# Patient Record
Sex: Female | Born: 1965 | Race: White | Hispanic: No | Smoking: Never smoker
Health system: Southern US, Community
[De-identification: ages and names within clinical notes are randomized; demographics above are authoritative.]

## PROBLEM LIST (undated history)

## (undated) DIAGNOSIS — I10 Essential (primary) hypertension: Secondary | ICD-10-CM

## (undated) DIAGNOSIS — M797 Fibromyalgia: Secondary | ICD-10-CM

## (undated) DIAGNOSIS — K589 Irritable bowel syndrome without diarrhea: Secondary | ICD-10-CM

## (undated) DIAGNOSIS — N390 Urinary tract infection, site not specified: Secondary | ICD-10-CM

## (undated) DIAGNOSIS — E7800 Pure hypercholesterolemia, unspecified: Secondary | ICD-10-CM

## (undated) DIAGNOSIS — K219 Gastro-esophageal reflux disease without esophagitis: Secondary | ICD-10-CM

## (undated) HISTORY — DX: Urinary tract infection, site not specified: N39.0

## (undated) HISTORY — PX: OTHER SURGICAL HISTORY: SHX169

---

## 1997-12-12 ENCOUNTER — Inpatient Hospital Stay (HOSPITAL_COMMUNITY): Admission: AD | Admit: 1997-12-12 | Discharge: 1997-12-15 | Payer: Self-pay | Admitting: Obstetrics & Gynecology

## 1999-12-11 ENCOUNTER — Ambulatory Visit (HOSPITAL_COMMUNITY): Admission: RE | Admit: 1999-12-11 | Discharge: 1999-12-11 | Payer: Self-pay | Admitting: Gastroenterology

## 1999-12-11 ENCOUNTER — Encounter (INDEPENDENT_AMBULATORY_CARE_PROVIDER_SITE_OTHER): Payer: Self-pay | Admitting: *Deleted

## 2001-07-08 ENCOUNTER — Other Ambulatory Visit: Admission: RE | Admit: 2001-07-08 | Discharge: 2001-07-08 | Payer: Self-pay | Admitting: Obstetrics and Gynecology

## 2004-01-23 ENCOUNTER — Ambulatory Visit: Payer: Self-pay | Admitting: Family Medicine

## 2005-07-12 ENCOUNTER — Ambulatory Visit: Payer: Self-pay | Admitting: Family Medicine

## 2005-11-19 ENCOUNTER — Other Ambulatory Visit: Admission: RE | Admit: 2005-11-19 | Discharge: 2005-11-19 | Payer: Self-pay | Admitting: Obstetrics and Gynecology

## 2006-06-20 ENCOUNTER — Ambulatory Visit: Payer: Self-pay | Admitting: Family Medicine

## 2006-09-17 ENCOUNTER — Encounter: Admission: RE | Admit: 2006-09-17 | Discharge: 2006-11-07 | Payer: Self-pay | Admitting: Neurology

## 2006-12-05 ENCOUNTER — Encounter: Admission: RE | Admit: 2006-12-05 | Discharge: 2007-01-02 | Payer: Self-pay | Admitting: Orthopaedic Surgery

## 2009-05-02 ENCOUNTER — Other Ambulatory Visit: Admission: RE | Admit: 2009-05-02 | Discharge: 2009-05-02 | Payer: Self-pay | Admitting: Obstetrics & Gynecology

## 2009-05-04 ENCOUNTER — Ambulatory Visit (HOSPITAL_COMMUNITY): Admission: RE | Admit: 2009-05-04 | Discharge: 2009-05-04 | Payer: Self-pay | Admitting: Obstetrics & Gynecology

## 2009-05-11 ENCOUNTER — Encounter: Admission: RE | Admit: 2009-05-11 | Discharge: 2009-05-11 | Payer: Self-pay | Admitting: Obstetrics & Gynecology

## 2010-03-31 ENCOUNTER — Encounter: Payer: Self-pay | Admitting: Obstetrics and Gynecology

## 2010-07-27 NOTE — Procedures (Signed)
[place]. [facility]  Patient:    Bianca Payne, Bianca Payne                        MRN: [id number] Proc. Date: 12/11/99 Adm. Date:  [id number] Attending:  Nelda Marseille CC:         Colon Flattery, D.O.   Procedure Report  PROCEDURE PERFORMED:  Colonoscopy with biopsy.  INDICATION:  Diarrhea and some incontinence.  CONSENT:  Consent was signed after risks, benefits, methods and options thoroughly discussed in the office.  MEDICINES USED:  Demerol 100, Versed 10.  DESCRIPTION OF PROCEDURE: Rectal inspection was pertinent for small external hemorrhoids.  Digital exam was negative.  Video pediatric colonoscope was inserted with some difficulty due to a tortuous colon.  We were able to advance into the cecum.  No obvious abnormalities seen on insertion.  To advance to the cecum did require some abdominal pressure but no position changes.  The cecum was identified by the appendiceal orifice and the ileocecal valve.  In fact the scope was inserted a short ways into the terminal ileum which was normal.  Photo documentation was obtained.  Terminal ileum was normal.  Scattered random biopsies were obtained.  The scope was then slowly withdrawn.  The colon was normal.  Prep was adequate.  There was some liquid stool that required washing and suctioning. We did take some random biopsies through all areas on slow withdrawal. Once back in the rectum, the scope was retroflexed pertinent for some internal hemorrhoids.  On straight visualization of the rectum, a tiny 2 mm hyperplastic polyp was seen which was cold biopsied as well.  The scope was reinserted a short ways up the sigmoid.  Air was suctioned and the scope removed.  The patient tolerated the procedure well.  There was no obvious immediate complication.  ENDOSCOPIC DIAGNOSES: 1. Small internal and external hemorrhoids. 2. Tortuous colon. 3. Tiny rectal polyps status post cold biopsy. 4. Otherwise within normal  limits to the terminal ileum status post random    biopsies throughout.  PLAN:  Await pathology.  Trial of Librax.  Call p.r.n. and otherwise follow up in [place] in one to two months to recheck symptoms and decide any other workup and plan or medicine trails. DD:  12/11/99 TD:  12/12/99 Job: [id number]

## 2010-08-31 ENCOUNTER — Other Ambulatory Visit: Payer: Self-pay | Admitting: Obstetrics & Gynecology

## 2010-08-31 ENCOUNTER — Other Ambulatory Visit (HOSPITAL_COMMUNITY)
Admission: RE | Admit: 2010-08-31 | Discharge: 2010-08-31 | Disposition: A | Discharge status: Home / Self Care | Payer: Self-pay | Source: Ambulatory Visit | Attending: Obstetrics & Gynecology | Admitting: Obstetrics & Gynecology

## 2010-08-31 DIAGNOSIS — Z01419 Encounter for gynecological examination (general) (routine) without abnormal findings: Secondary | ICD-10-CM | POA: Insufficient documentation

## 2011-05-26 ENCOUNTER — Emergency Department (HOSPITAL_COMMUNITY)
Admission: EM | Admit: 2011-05-26 | Discharge: 2011-05-27 | Disposition: A | Discharge status: Home / Self Care | Payer: BC Managed Care – PPO | Attending: Emergency Medicine | Admitting: Emergency Medicine

## 2011-05-26 ENCOUNTER — Encounter (HOSPITAL_COMMUNITY): Payer: Self-pay | Admitting: *Deleted

## 2011-05-26 DIAGNOSIS — R05 Cough: Secondary | ICD-10-CM | POA: Insufficient documentation

## 2011-05-26 DIAGNOSIS — J189 Pneumonia, unspecified organism: Secondary | ICD-10-CM

## 2011-05-26 DIAGNOSIS — R509 Fever, unspecified: Secondary | ICD-10-CM | POA: Insufficient documentation

## 2011-05-26 DIAGNOSIS — R059 Cough, unspecified: Secondary | ICD-10-CM | POA: Insufficient documentation

## 2011-05-26 HISTORY — DX: Fibromyalgia: M79.7

## 2011-05-26 HISTORY — DX: Irritable bowel syndrome, unspecified: K58.9

## 2011-05-26 LAB — RAPID STREP SCREEN (MED CTR MEBANE ONLY): Streptococcus, Group A Screen (Direct): NEGATIVE

## 2011-05-26 MED ORDER — ONDANSETRON HCL 4 MG/2ML IJ SOLN
4.0000 mg | Freq: Once | INTRAMUSCULAR | Status: AC
  Administered 2011-05-27: 4 mg via INTRAVENOUS
  Filled 2011-05-26: qty 2

## 2011-05-26 MED ORDER — MORPHINE SULFATE 2 MG/ML IJ SOLN
2.0000 mg | Freq: Once | INTRAMUSCULAR | Status: AC
  Administered 2011-05-27: 2 mg via INTRAVENOUS
  Filled 2011-05-26: qty 1

## 2011-05-26 MED ORDER — SODIUM CHLORIDE 0.9 % IV SOLN
Freq: Once | INTRAVENOUS | Status: AC
  Administered 2011-05-27: 01:00:00 via INTRAVENOUS

## 2011-05-26 MED ORDER — SODIUM CHLORIDE 0.9 % IV BOLUS (SEPSIS)
1000.0000 mL | Freq: Once | INTRAVENOUS | Status: AC
  Administered 2011-05-27: 1000 mL via INTRAVENOUS

## 2011-05-26 MED ORDER — ALBUTEROL SULFATE (5 MG/ML) 0.5% IN NEBU
2.5000 mg | INHALATION_SOLUTION | Freq: Once | RESPIRATORY_TRACT | Status: AC
  Administered 2011-05-26: 2.5 mg via RESPIRATORY_TRACT
  Filled 2011-05-26: qty 0.5

## 2011-05-26 MED ORDER — KETOROLAC TROMETHAMINE 30 MG/ML IJ SOLN
30.0000 mg | Freq: Once | INTRAMUSCULAR | Status: AC
  Administered 2011-05-27: 30 mg via INTRAVENOUS
  Filled 2011-05-26: qty 1

## 2011-05-27 ENCOUNTER — Emergency Department (HOSPITAL_COMMUNITY): Payer: BC Managed Care – PPO

## 2011-05-27 MED ORDER — SODIUM CHLORIDE 0.9 % IV BOLUS (SEPSIS)
1000.0000 mL | Freq: Once | INTRAVENOUS | Status: AC
  Administered 2011-05-27: 1000 mL via INTRAVENOUS

## 2011-05-27 MED ORDER — HYDROCODONE-ACETAMINOPHEN 5-325 MG PO TABS: ORAL_TABLET | ORAL | Status: DC

## 2011-05-27 MED ORDER — DEXTROSE 5 % IV SOLN
1.0000 g | Freq: Once | INTRAVENOUS | Status: AC
  Administered 2011-05-27: 1 g via INTRAVENOUS
  Filled 2011-05-27: qty 10

## 2011-05-27 MED ORDER — LEVOFLOXACIN 500 MG PO TABS: 500.0000 mg | ORAL_TABLET | Freq: Every day | ORAL | Status: AC

## 2011-05-27 MED ORDER — ALBUTEROL SULFATE HFA 108 (90 BASE) MCG/ACT IN AERS: 1.0000 | INHALATION_SPRAY | Freq: Four times a day (QID) | RESPIRATORY_TRACT | Status: AC | PRN

## 2011-05-27 MED ORDER — LEVOFLOXACIN 500 MG PO TABS
500.0000 mg | ORAL_TABLET | Freq: Once | ORAL | Status: AC
  Administered 2011-05-27: 500 mg via ORAL
  Filled 2011-05-27: qty 1

## 2011-05-27 NOTE — Discharge Instructions (Signed)
Your x-ray shows beginning of a right middle lobe pneumonia. Drink lots of fluids. Use Tylenol alternating with ibuprofen for any fevers, aches. and pains. Take the antibiotic as directed. Use of the Vicodin for both pain and her cough. Use the inhaler 2 puffs 4 times a day for the next 4 days, then as needed for wheezing. Followup with your doctor.    Pneumonia, Adult Pneumonia is an infection of the lungs. It may be caused by a germ (virus or bacteria). Some types of pneumonia can spread easily from person to person. This can happen when you cough or sneeze. HOME CARE  Only take medicine as told by your doctor.   Take your medicine (antibiotics) as told. Finish it even if you start to feel better.   Do not smoke.   You may use a vaporizer or humidifier in your room. This can help loosen thick spit (mucus).   Sleep so you are almost sitting up (semi-upright). This helps reduce coughing.   Rest.  A shot (vaccine) can help prevent pneumonia. Shots are often advised for:  People over 76 years old.   Patients on chemotherapy.   People with long-term (chronic) lung problems.   People with immune system problems.  GET HELP RIGHT AWAY IF:   You are getting worse.   You cannot control your cough, and you are losing sleep.   You cough up blood.   Your pain gets worse, even with medicine.   You have a fever.   Any of your problems are getting worse, not better.   You have shortness of breath or chest pain.  MAKE SURE YOU:   Understand these instructions.   Will watch your condition.   Will get help right away if you are not doing well or get worse.  Document Released: 08/14/2007 Document Revised: 02/14/2011 Document Reviewed: 05/18/2010 [facility] Patient Information 2012 [place], [place].

## 2011-05-27 NOTE — ED Provider Notes (Signed)
History     CSN: [id number]  Arrival date & time 05/26/11  2128   First MD Initiated Contact with Patient 05/26/11 2346      Chief Complaint  Patient presents with  . Sore Throat  . Generalized Body Aches  . Fever    (Consider location/radiation/quality/duration/timing/severity/associated sxs/prior treatment) HPI Bianca Payne is a 46 y.o. female who presents to the Emergency Department complaining of flulike symptoms for several days. They include headache, sore throat, nasal congestion, body aches, cough, and nausea, and chills. She denies fever,vomiting, diarrhea, shortness of breath, weakness. She has experienced some chest discomfort with coughing. She is taking no medicines.  PCp  Dr. Lysbeth Galas Past Medical History  Diagnosis Date  . IBS (irritable bowel syndrome)   . Fibromyalgia     History reviewed. No pertinent past surgical history.  History reviewed. No pertinent family history.  History  Substance Use Topics  . Smoking status: Never Smoker   . Smokeless tobacco: Never Used  . Alcohol Use: No    OB History    Grav Para Term Preterm Abortions TAB SAB Ect Mult Living                  Review of Systems ROS: Statement: All systems negative except as marked or noted in the HPI; Constitutional: Negative for fever and chills. ; ; Eyes: Negative for eye pain, redness and discharge. ; ; ENMT: Negative for ear pain, hoarseness,  sinus pressure . ; ; Cardiovascular: Negative for chest pain, palpitations, diaphoresis, dyspnea and peripheral edema. ; ; Respiratory: Negative for   stridor. ; ; Gastrointestinal: Negative for  vomiting, diarrhea, abdominal pain, blood in stool, hematemesis, jaundice and rectal bleeding. . ; ; Genitourinary: Negative for dysuria, flank pain and hematuria. ; ; Musculoskeletal: Negative for back pain and neck pain. Negative for swelling and trauma.Positive for body aches; ; Skin: Negative for pruritus, rash, abrasions, blisters, bruising and skin  lesion.; ; Neuro: Negative for headache, lightheadedness and neck stiffness. Negative for weakness, altered level of consciousness , altered mental status, extremity weakness, paresthesias, involuntary movement, seizure and syncope.     Allergies  Review of patient's allergies indicates no known allergies.  Home Medications   Current Outpatient Rx  Name Route Sig Dispense Refill  . CLINDINIUM-CHLORDIAZEPOXIDE 2.5-5 MG PO CAPS Oral Take 1 capsule by mouth 3 (three) times daily as needed.    . DULOXETINE HCL 30 MG PO CPEP Oral Take 30 mg by mouth daily.    Marland Kitchen MESALAMINE 800 MG PO TBEC Oral Take by mouth.    . ONE-DAILY MULTI VITAMINS PO TABS Oral Take 1 tablet by mouth daily.      BP 163/90  Pulse 113  Temp(Src) 101.9 F (38.8 C) (Oral)  Resp 20  Ht 5\' 2"  (1.575 m)  Wt 170 lb (77.111 kg)  BMI 31.09 kg/m2  SpO2 100%  LMP 05/05/2011  Physical Exam Physical examination:  Nursing notes reviewed; Vital signs and O2 SAT reviewed;  Constitutional: Well developed, Well nourished, Well hydrated, In no acute distress; Head:  Normocephalic, atraumatic; Eyes: EOMI, PERRL, No scleral icterus; ENMT: Mouth and pharynx normal, Mucous membranes moist; Neck: Supple, Full range of motion, No lymphadenopathy; Cardiovascular: tachycardia and  Regular rhythm, No murmur, rub, or gallop; Respiratory: Dry cough.Breath sounds clear & equal bilaterally, No rales, rhonchi,  or rub, Occasional end expiratory wheeeze Normal respiratory effort/excursion; Chest: Nontender, Movement normal; Abdomen: Soft, Nontender, Nondistended, Normal bowel sounds; Genitourinary: No CVA tenderness; Extremities: Pulses  normal, No tenderness, No edema, No calf edema or asymmetry.; Neuro: AA&Ox3, Major CN grossly intact.  No gross focal motor or sensory deficits in extremities.; Skin: Color normal, Warm, Dry  ED Course  Procedures (including critical care time) Results for orders placed during the hospital encounter of 05/26/11  RAPID  STREP SCREEN      Component Value Range   Streptococcus, Group A Screen (Direct) NEGATIVE  NEGATIVE    Dg Chest 2 View  05/27/2011  *RADIOLOGY REPORT*  Clinical Data: Cough and sore throat  CHEST - 2 VIEW  Comparison: None  Findings: The heart size appears normal.  There is no pleural effusion or pulmonary edema.  Airspace consolidation involving the right middle lobe is identified consistent with pneumonia.  Left lung is clear.  IMPRESSION:  1.  Right middle lobe pneumonia.  Original Report Authenticated By: Rosealee Albee, M.D.     MDM  Patient with flulike symptoms that began several days ago. Given IV fluids, antiemetic, anti-inflammatory, analgesic, PPI, with improvement. Given albuterol treatment with improvement in breathing. Chest xray with right middle lobe pneumonia. Initiated antibiotic treatment. Patient able to take by mouth fluids.  Pt feels improved after observation and/or treatment in ED.Pt stable in ED with no significant deterioration in condition.The patient appears reasonably screened and/or stabilized for discharge and I doubt any other medical condition or other [facility] requiring further screening, evaluation, or treatment in the ED at this time prior to discharge.  MDM Reviewed: nursing note and vitals Interpretation: labs and x-ray           Nicoletta Dress. Colon Branch, MD 05/27/11 1610

## 2012-08-10 ENCOUNTER — Encounter (HOSPITAL_COMMUNITY): Payer: Self-pay

## 2012-08-10 ENCOUNTER — Emergency Department (HOSPITAL_COMMUNITY)
Admission: EM | Admit: 2012-08-10 | Discharge: 2012-08-10 | Disposition: A | Discharge status: Home / Self Care | Payer: BC Managed Care – PPO | Attending: Emergency Medicine | Admitting: Emergency Medicine

## 2012-08-10 ENCOUNTER — Emergency Department (HOSPITAL_COMMUNITY): Payer: BC Managed Care – PPO

## 2012-08-10 DIAGNOSIS — Z79899 Other long term (current) drug therapy: Secondary | ICD-10-CM | POA: Insufficient documentation

## 2012-08-10 DIAGNOSIS — R0602 Shortness of breath: Secondary | ICD-10-CM | POA: Insufficient documentation

## 2012-08-10 DIAGNOSIS — Z8719 Personal history of other diseases of the digestive system: Secondary | ICD-10-CM | POA: Insufficient documentation

## 2012-08-10 DIAGNOSIS — K219 Gastro-esophageal reflux disease without esophagitis: Secondary | ICD-10-CM | POA: Insufficient documentation

## 2012-08-10 DIAGNOSIS — R0789 Other chest pain: Secondary | ICD-10-CM

## 2012-08-10 LAB — POCT I-STAT TROPONIN I: Troponin i, poc: 0 ng/mL (ref 0.00–0.08)

## 2012-08-10 LAB — CBC
MCH: 31.1 pg (ref 26.0–34.0)
Platelets: 282 10*3/uL (ref 150–400)
RBC: 4.5 MIL/uL (ref 3.87–5.11)

## 2012-08-10 LAB — BASIC METABOLIC PANEL
CO2: 30 mEq/L (ref 19–32)
Calcium: 9.2 mg/dL (ref 8.4–10.5)
GFR calc non Af Amer: >90 mL/min (ref >90)
Potassium: 3.4 mEq/L — ABNORMAL LOW (ref 3.5–5.1)
Sodium: 143 mEq/L (ref 135–145)

## 2012-08-10 MED ORDER — GI COCKTAIL ~~LOC~~
30.0000 mL | Freq: Once | ORAL | Status: AC
  Administered 2012-08-10: 30 mL via ORAL
  Filled 2012-08-10: qty 30

## 2012-08-10 MED ORDER — ONDANSETRON HCL 4 MG/2ML IJ SOLN
4.0000 mg | Freq: Once | INTRAMUSCULAR | Status: AC
  Administered 2012-08-10: 4 mg via INTRAVENOUS
  Filled 2012-08-10: qty 2

## 2012-08-10 MED ORDER — PANTOPRAZOLE SODIUM 40 MG IV SOLR
40.0000 mg | Freq: Once | INTRAVENOUS | Status: AC
  Administered 2012-08-10: 40 mg via INTRAVENOUS
  Filled 2012-08-10: qty 40

## 2012-08-10 NOTE — ED Notes (Signed)
Pt c/o chest tightness x 1 hour, states she awoke sob.

## 2012-08-10 NOTE — ED Provider Notes (Addendum)
History     CSN: [id number]  Arrival date & time 08/10/12  0401   First MD Initiated Contact with Patient 08/10/12 [phone]      Chief Complaint  Patient presents with  . Chest Pain  . Shortness of Breath    (Consider location/radiation/quality/duration/timing/severity/associated sxs/prior treatment) HPI HPI Comments: Bianca Payne is a 47 y.o. female who presents to the Emergency Department complaining of chest tightness and burning x one hour makes is hard to breath. Feels like a weight on the middle of her chest.  She has a h/o GERD on prilosec and asacol at bedtime. Denies fever, chills, vomiting, cough.   Past Medical History  Diagnosis Date  . IBS (irritable bowel syndrome)   . Fibromyalgia     History reviewed. No pertinent past surgical history.  No family history on file.  History  Substance Use Topics  . Smoking status: Never Smoker   . Smokeless tobacco: Never Used  . Alcohol Use: No    OB History   Grav Para Term Preterm Abortions TAB SAB Ect Mult Living                  Review of Systems  Constitutional: Negative for fever.       10 Systems reviewed and are negative for acute change except as noted in the HPI.  HENT: Negative for congestion.   Eyes: Negative for discharge and redness.  Respiratory: Positive for chest tightness. Negative for cough and shortness of breath.   Cardiovascular: Negative for chest pain.  Gastrointestinal: Negative for vomiting and abdominal pain.  Musculoskeletal: Negative for back pain.  Skin: Negative for rash.  Neurological: Negative for syncope, numbness and headaches.  Psychiatric/Behavioral:       No behavior change.    Allergies  Review of patient's allergies indicates no known allergies.  Home Medications   Current Outpatient Rx  Name  Route  Sig  Dispense  Refill  . clidinium-chlordiazePOXIDE (LIBRAX) 2.5-5 MG per capsule   Oral   Take 1 capsule by mouth 3 (three) times daily as needed.         .  DULoxetine (CYMBALTA) 30 MG capsule   Oral   Take 30 mg by mouth daily.         . Mesalamine (ASACOL HD) 800 MG TBEC   Oral   Take by mouth.         . Multiple Vitamin (MULTIVITAMIN) tablet   Oral   Take 1 tablet by mouth daily.         Marland Kitchen omeprazole (PRILOSEC) 20 MG capsule   Oral   Take 20 mg by mouth daily.         . pregabalin (LYRICA) 100 MG capsule   Oral   Take 100 mg by mouth daily.         Marland Kitchen HYDROcodone-acetaminophen (NORCO) 5-325 MG per tablet      Take one every 4 hours as needed for pain or for cough   15 tablet   0     BP 107/87  Pulse 89  Temp(Src) 99.3 F (37.4 C) (Oral)  Resp 22  Ht 5\' 2"  (1.575 m)  Wt 175 lb (79.379 kg)  BMI 32 kg/m2  SpO2 100%  LMP 07/12/2012  Physical Exam  Nursing note and vitals reviewed. Constitutional: She appears well-developed and well-nourished.  Awake, alert, nontoxic appearance.  HENT:  Head: Normocephalic and atraumatic.  Right Ear: External ear normal.  Left Ear: External ear normal.  Eyes: EOM are normal. Pupils are equal, round, and reactive to light.  Neck: Normal range of motion. Neck supple.  Cardiovascular: Normal rate and intact distal pulses.   Pulmonary/Chest: Effort normal and breath sounds normal. She exhibits no tenderness.  Abdominal: Soft. Bowel sounds are normal. There is no tenderness. There is no rebound.  Musculoskeletal: She exhibits no tenderness.  Baseline ROM, no obvious new focal weakness.  Neurological:  Mental status and motor strength appears baseline for patient and situation.  Skin: No rash noted.  Psychiatric: She has a normal mood and affect.    ED Course  Procedures (including critical care time)  Results for orders placed during the hospital encounter of 08/10/12  CBC      Result Value Range   WBC 9.1  4.0 - 10.5 K/uL   RBC 4.50  3.87 - 5.11 MIL/uL   Hemoglobin 14.0  12.0 - 15.0 g/dL   HCT 09.8  11.9 - 14.7 %   MCV 91.6  78.0 - 100.0 fL   MCH 31.1  26.0 - 34.0  pg   MCHC 34.0  30.0 - 36.0 g/dL   RDW 82.9  56.2 - 13.0 %   Platelets 282  150 - 400 K/uL  BASIC METABOLIC PANEL      Result Value Range   Sodium 143  135 - 145 mEq/L   Potassium 3.4 (*) 3.5 - 5.1 mEq/L   Chloride 103  96 - 112 mEq/L   CO2 30  19 - 32 mEq/L   Glucose, Bld 125 (*) 70 - 99 mg/dL   BUN 8  6 - 23 mg/dL   Creatinine, Ser 8.65  0.50 - 1.10 mg/dL   Calcium 9.2  8.4 - 78.4 mg/dL   GFR calc non Af Amer >90  >90 mL/min   GFR calc Af Amer >90  >90 mL/min  POCT I-STAT TROPONIN I      Result Value Range   Troponin i, poc 0.00  0.00 - 0.08 ng/mL   Comment 3            Dg Chest Portable 1 View  08/10/2012   *RADIOLOGY REPORT*  Clinical Data: Sudden onset of shortness of breath.  Chest pain.  PORTABLE CHEST - 1 VIEW  Comparison: Chest radiograph performed 05/27/2011  Findings: The lungs are well-aerated.  Mild bibasilar airspace opacities could reflect mild pneumonia or possibly atelectasis.  No pleural effusion or pneumothorax is seen.  Mild vascular congestion is noted.  The cardiomediastinal silhouette is within normal limits.  No acute osseous abnormalities are seen.  IMPRESSION: Mild bibasilar airspace opacities could reflect mild pneumonia or possibly atelectasis.  Mild vascular congestion noted.   Original Report Authenticated By: Tonia Ghent, M.D.     Date: 08/10/2012   0415  Rate: 82  Rhythm: normal sinus rhythm  QRS Axis: normal  Intervals: normal  ST/T Wave abnormalities: normal  Conduction Disutrbances: none  Narrative Interpretation: unremarkable      MDM  Patient with increased acid reflux causing chest tightness and shortness of breath. Relieved with GI cocktail, prilosec, zofran. Pt stable in ED with no significant deterioration in condition.The patient appears reasonably screened and/or stabilized for discharge and I doubt any other medical condition or other [facility] requiring further screening, evaluation, or treatment in the ED at this time prior to  discharge.  MDM Reviewed: nursing note and vitals Interpretation: labs, ECG and x-ray           Nicoletta Dress. Colon Branch, MD 08/10/12  1610  Nicoletta Dress. Colon Branch, MD 08/10/12 [phone]

## 2013-01-04 ENCOUNTER — Encounter: Payer: Self-pay | Admitting: Obstetrics & Gynecology

## 2013-01-04 ENCOUNTER — Other Ambulatory Visit (HOSPITAL_COMMUNITY)
Admission: RE | Admit: 2013-01-04 | Discharge: 2013-01-04 | Disposition: A | Discharge status: Home / Self Care | Payer: BC Managed Care – PPO | Source: Ambulatory Visit | Attending: Obstetrics & Gynecology | Admitting: Obstetrics & Gynecology

## 2013-01-04 ENCOUNTER — Ambulatory Visit (INDEPENDENT_AMBULATORY_CARE_PROVIDER_SITE_OTHER): Payer: BC Managed Care – PPO | Admitting: Obstetrics & Gynecology

## 2013-01-04 VITALS — BP 120/80 | Ht 64.0 in | Wt 184.0 lb

## 2013-01-04 DIAGNOSIS — Z01419 Encounter for gynecological examination (general) (routine) without abnormal findings: Secondary | ICD-10-CM | POA: Insufficient documentation

## 2013-01-04 DIAGNOSIS — B373 Candidiasis of vulva and vagina: Secondary | ICD-10-CM

## 2013-01-04 DIAGNOSIS — Z1151 Encounter for screening for human papillomavirus (HPV): Secondary | ICD-10-CM | POA: Insufficient documentation

## 2013-01-04 DIAGNOSIS — R319 Hematuria, unspecified: Secondary | ICD-10-CM

## 2013-01-04 LAB — POCT URINALYSIS DIPSTICK
Glucose, UA: NEGATIVE
Nitrite, UA: NEGATIVE

## 2013-01-04 MED ORDER — TERCONAZOLE 0.4 % VA CREA: 1.0000 | TOPICAL_CREAM | Freq: Every day | VAGINAL | Status: DC

## 2013-01-04 NOTE — Progress Notes (Signed)
Patient ID: Bianca Payne, female   DOB: 01-04-1966, 47 y.o.   MRN: [id number] Subjective:     Bianca Payne is a 47 y.o. female here for a routine exam.  Patient's last menstrual period was 12/18/2012. No obstetric history on file. Current complaints: none, being treated for UTI.   Gynecologic History Patient's last menstrual period was 12/18/2012. Contraception: none Last Pap: 2013. Results were: normal Last mammogram: 2014. Results were: normal  Past Medical History  Diagnosis Date  . IBS (irritable bowel syndrome)   . Fibromyalgia   . UTI (lower urinary tract infection)     History reviewed. No pertinent past surgical history.  OB History   Grav Para Term Preterm Abortions TAB SAB Ect Mult Living                  History   Social History  . Marital Status: Legally Separated    Spouse Name: N/A    Number of Children: N/A  . Years of Education: N/A   Social History Main Topics  . Smoking status: Never Smoker   . Smokeless tobacco: Never Used  . Alcohol Use: No  . Drug Use: No  . Sexual Activity: None   Other Topics Concern  . None   Social History Narrative  . None    Family History  Problem Relation Age of Onset  . Breast cancer Maternal Grandmother      Review of Systems  Review of Systems  Constitutional: Negative for fever, chills, weight loss, malaise/fatigue and diaphoresis.  HENT: Negative for hearing loss, ear pain, nosebleeds, congestion, sore throat, neck pain, tinnitus and ear discharge.   Eyes: Negative for blurred vision, double vision, photophobia, pain, discharge and redness.  Respiratory: Negative for cough, hemoptysis, sputum production, shortness of breath, wheezing and stridor.   Cardiovascular: Negative for chest pain, palpitations, orthopnea, claudication, leg swelling and PND.  Gastrointestinal: negative for abdominal pain. Negative for heartburn, nausea, vomiting, diarrhea, constipation, blood in stool and melena.   Genitourinary: Negative for dysuria, urgency, frequency, hematuria and flank pain.  Musculoskeletal: Negative for myalgias, back pain, joint pain and falls.  Skin: Negative for itching and rash.  Neurological: Negative for dizziness, tingling, tremors, sensory change, speech change, focal weakness, seizures, loss of consciousness, weakness and headaches.  Endo/Heme/Allergies: Negative for environmental allergies and polydipsia. Does not bruise/bleed easily.  Psychiatric/Behavioral: Negative for depression, suicidal ideas, hallucinations, memory loss and substance abuse. The patient is not nervous/anxious and does not have insomnia.        Objective:    Physical Exam  Vitals reviewed. Constitutional: She is oriented to person, place, and time. She appears well-developed and well-nourished.  HENT:  Head: Normocephalic and atraumatic.        Right Ear: External ear normal.  Left Ear: External ear normal.  Nose: Nose normal.  Mouth/Throat: Oropharynx is clear and moist.  Eyes: Conjunctivae and EOM are normal. Pupils are equal, round, and reactive to light. Right eye exhibits no discharge. Left eye exhibits no discharge. No scleral icterus.  Neck: Normal range of motion. Neck supple. No tracheal deviation present. No thyromegaly present.  Cardiovascular: Normal rate, regular rhythm, normal heart sounds and intact distal pulses.  Exam reveals no gallop and no friction rub.   No murmur heard. Respiratory: Effort normal and breath sounds normal. No respiratory distress. She has no wheezes. She has no rales. She exhibits no tenderness.  GI: Soft. Bowel sounds are normal. She exhibits no distension and no mass.  There is no tenderness. There is no rebound and no guarding.  Genitourinary:  Breasts no masses skin changes or nipple changes bilaterally      Vulva is normal without lesions Vagina is pink moist without discharge +yeast Cervix normal in appearance and pap is done Uterus is normal size  shape and contour Adnexa is negative with normal sized ovaries   Musculoskeletal: Normal range of motion. She exhibits no edema and no tenderness.  Neurological: She is alert and oriented to person, place, and time. She has normal reflexes. She displays normal reflexes. No cranial nerve deficit. She exhibits normal muscle tone. Coordination normal.  Skin: Skin is warm and dry. No rash noted. No erythema. No pallor.  Psychiatric: She has a normal mood and affect. Her behavior is normal. Judgment and thought content normal.       Assessment:    Healthy female exam.    Plan:    Follow up in: 1 year.   terazol 7

## 2013-01-04 NOTE — Addendum Note (Signed)
Addended by: Criss Alvine on: 01/04/2013 12:30 PM   Modules accepted: Orders

## 2013-01-11 ENCOUNTER — Telehealth: Payer: Self-pay | Admitting: Obstetrics & Gynecology

## 2013-01-11 NOTE — Telephone Encounter (Signed)
Trichomonas on pap, will see tomorrow for wet prep

## 2013-01-12 ENCOUNTER — Encounter: Payer: Self-pay | Admitting: Obstetrics & Gynecology

## 2013-01-12 ENCOUNTER — Ambulatory Visit (INDEPENDENT_AMBULATORY_CARE_PROVIDER_SITE_OTHER): Payer: BC Managed Care – PPO | Admitting: Obstetrics & Gynecology

## 2013-01-12 ENCOUNTER — Encounter (INDEPENDENT_AMBULATORY_CARE_PROVIDER_SITE_OTHER): Payer: Self-pay

## 2013-01-12 VITALS — BP 120/80 | Ht 64.0 in | Wt 186.0 lb

## 2013-01-12 DIAGNOSIS — A599 Trichomoniasis, unspecified: Secondary | ICD-10-CM

## 2013-01-12 MED ORDER — METRONIDAZOLE 500 MG PO TABS: ORAL_TABLET | ORAL | Status: DC

## 2013-01-12 NOTE — Progress Notes (Signed)
Patient ID: Bianca Payne, female   DOB: [birth year], 47 y.o.   MRN: [id number] +Trichomonas on pap  Wet prep today confirms the infection   metronidazole 2 grams po for pt and partner  No sex  Follow up in 3 weeks

## 2013-02-02 ENCOUNTER — Ambulatory Visit (INDEPENDENT_AMBULATORY_CARE_PROVIDER_SITE_OTHER): Payer: BC Managed Care – PPO | Admitting: Obstetrics & Gynecology

## 2013-02-02 ENCOUNTER — Encounter: Payer: Self-pay | Admitting: Obstetrics & Gynecology

## 2013-02-02 VITALS — BP 120/80 | Wt 186.0 lb

## 2013-02-02 DIAGNOSIS — A5901 Trichomonal vulvovaginitis: Secondary | ICD-10-CM

## 2013-02-02 NOTE — Progress Notes (Signed)
Patient ID: Bianca Payne, female   DOB: 1966-01-24, 47 y.o.   MRN: [id number] Wet prep negative  Status post treatment for trichomonas Symptoms are improved  Follow up prn or yearly

## 2014-01-11 ENCOUNTER — Other Ambulatory Visit (HOSPITAL_COMMUNITY): Payer: Self-pay | Admitting: Adult Health Nurse Practitioner

## 2014-01-11 DIAGNOSIS — L723 Sebaceous cyst: Secondary | ICD-10-CM

## 2014-01-14 ENCOUNTER — Ambulatory Visit (HOSPITAL_COMMUNITY)
Admission: RE | Admit: 2014-01-14 | Discharge: 2014-01-14 | Disposition: A | Discharge status: Home / Self Care | Payer: BC Managed Care – PPO | Source: Ambulatory Visit | Attending: Adult Health Nurse Practitioner | Admitting: Adult Health Nurse Practitioner

## 2014-01-14 DIAGNOSIS — L723 Sebaceous cyst: Secondary | ICD-10-CM

## 2014-01-14 DIAGNOSIS — R221 Localized swelling, mass and lump, neck: Secondary | ICD-10-CM | POA: Diagnosis not present

## 2016-06-02 ENCOUNTER — Encounter (HOSPITAL_COMMUNITY): Payer: Self-pay | Admitting: Emergency Medicine

## 2016-06-02 ENCOUNTER — Emergency Department (HOSPITAL_COMMUNITY): Payer: BLUE CROSS/BLUE SHIELD

## 2016-06-02 ENCOUNTER — Emergency Department (HOSPITAL_COMMUNITY)
Admission: EM | Admit: 2016-06-02 | Discharge: 2016-06-02 | Disposition: A | Discharge status: Home / Self Care | Payer: BLUE CROSS/BLUE SHIELD | Attending: Emergency Medicine | Admitting: Emergency Medicine

## 2016-06-02 DIAGNOSIS — Y92410 Unspecified street and highway as the place of occurrence of the external cause: Secondary | ICD-10-CM | POA: Diagnosis not present

## 2016-06-02 DIAGNOSIS — Z79899 Other long term (current) drug therapy: Secondary | ICD-10-CM | POA: Insufficient documentation

## 2016-06-02 DIAGNOSIS — S4991XA Unspecified injury of right shoulder and upper arm, initial encounter: Secondary | ICD-10-CM | POA: Diagnosis present

## 2016-06-02 DIAGNOSIS — Y939 Activity, unspecified: Secondary | ICD-10-CM | POA: Diagnosis not present

## 2016-06-02 DIAGNOSIS — S43101A Unspecified dislocation of right acromioclavicular joint, initial encounter: Secondary | ICD-10-CM

## 2016-06-02 DIAGNOSIS — M7918 Myalgia, other site: Secondary | ICD-10-CM

## 2016-06-02 DIAGNOSIS — Y999 Unspecified external cause status: Secondary | ICD-10-CM | POA: Diagnosis not present

## 2016-06-02 MED ORDER — OXYCODONE-ACETAMINOPHEN 5-325 MG PO TABS
1.0000 | ORAL_TABLET | Freq: Once | ORAL | Status: AC
  Administered 2016-06-02: 1 via ORAL
  Filled 2016-06-02: qty 1

## 2016-06-02 MED ORDER — IBUPROFEN 400 MG PO TABS
600.0000 mg | ORAL_TABLET | Freq: Once | ORAL | Status: DC
  Filled 2016-06-02: qty 1
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
## 2016-06-02 NOTE — ED Notes (Signed)
Unable to sign e signature not available, pt denies any concerns

## 2016-06-02 NOTE — ED Notes (Signed)
Patient transported to X-ray 

## 2016-06-02 NOTE — ED Notes (Signed)
See edp assessment 

## 2016-06-17 ENCOUNTER — Ambulatory Visit (INDEPENDENT_AMBULATORY_CARE_PROVIDER_SITE_OTHER): Payer: BLUE CROSS/BLUE SHIELD | Admitting: Orthopedic Surgery

## 2016-06-17 ENCOUNTER — Encounter (INDEPENDENT_AMBULATORY_CARE_PROVIDER_SITE_OTHER): Payer: Self-pay | Admitting: Orthopedic Surgery

## 2016-06-17 DIAGNOSIS — S4990XA Unspecified injury of shoulder and upper arm, unspecified arm, initial encounter: Secondary | ICD-10-CM

## 2016-06-17 MED ORDER — DICLOFENAC SODIUM 2 % TD SOLN: 2.0000 | Freq: Two times a day (BID) | TRANSDERMAL | 1 refills | Status: DC
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
## 2016-08-01 ENCOUNTER — Ambulatory Visit (INDEPENDENT_AMBULATORY_CARE_PROVIDER_SITE_OTHER): Payer: BLUE CROSS/BLUE SHIELD | Admitting: Orthopedic Surgery

## 2016-08-01 ENCOUNTER — Ambulatory Visit (INDEPENDENT_AMBULATORY_CARE_PROVIDER_SITE_OTHER): Payer: BLUE CROSS/BLUE SHIELD

## 2016-08-01 ENCOUNTER — Encounter (INDEPENDENT_AMBULATORY_CARE_PROVIDER_SITE_OTHER): Payer: Self-pay | Admitting: Orthopedic Surgery

## 2016-08-01 DIAGNOSIS — M5412 Radiculopathy, cervical region: Secondary | ICD-10-CM | POA: Diagnosis not present

## 2016-08-01 DIAGNOSIS — M25561 Pain in right knee: Secondary | ICD-10-CM | POA: Diagnosis not present

## 2016-08-01 DIAGNOSIS — M79641 Pain in right hand: Secondary | ICD-10-CM | POA: Diagnosis not present
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
## 2016-08-20 ENCOUNTER — Ambulatory Visit: Payer: BLUE CROSS/BLUE SHIELD | Admitting: Sports Medicine

## 2016-08-29 ENCOUNTER — Ambulatory Visit (INDEPENDENT_AMBULATORY_CARE_PROVIDER_SITE_OTHER): Payer: BLUE CROSS/BLUE SHIELD | Admitting: Orthopedic Surgery

## 2017-07-24 ENCOUNTER — Emergency Department (HOSPITAL_COMMUNITY): Payer: BLUE CROSS/BLUE SHIELD

## 2017-07-24 ENCOUNTER — Emergency Department (HOSPITAL_COMMUNITY)
Admission: EM | Admit: 2017-07-24 | Discharge: 2017-07-25 | Disposition: A | Discharge status: Home / Self Care | Payer: BLUE CROSS/BLUE SHIELD | Attending: Emergency Medicine | Admitting: Emergency Medicine

## 2017-07-24 ENCOUNTER — Other Ambulatory Visit: Payer: Self-pay

## 2017-07-24 ENCOUNTER — Encounter (HOSPITAL_COMMUNITY): Payer: Self-pay

## 2017-07-24 DIAGNOSIS — M25551 Pain in right hip: Secondary | ICD-10-CM | POA: Insufficient documentation

## 2017-07-24 DIAGNOSIS — R110 Nausea: Secondary | ICD-10-CM

## 2017-07-24 DIAGNOSIS — W19XXXA Unspecified fall, initial encounter: Secondary | ICD-10-CM

## 2017-07-24 DIAGNOSIS — Z9889 Other specified postprocedural states: Secondary | ICD-10-CM | POA: Diagnosis not present

## 2017-07-24 DIAGNOSIS — Z79899 Other long term (current) drug therapy: Secondary | ICD-10-CM | POA: Diagnosis not present

## 2017-07-24 DIAGNOSIS — R112 Nausea with vomiting, unspecified: Secondary | ICD-10-CM | POA: Diagnosis not present

## 2017-07-24 LAB — I-STAT BETA HCG BLOOD, ED (MC, WL, AP ONLY)

## 2017-07-24 LAB — COMPREHENSIVE METABOLIC PANEL
ALBUMIN: 4.7 g/dL (ref 3.5–5.0)
ALT: 19 U/L (ref 14–54)
ANION GAP: 13 (ref 5–15)
AST: 23 U/L (ref 15–41)
Alkaline Phosphatase: 105 U/L (ref 38–126)
BUN: 6 mg/dL (ref 6–20)
CHLORIDE: 105 mmol/L (ref 101–111)
CO2: 26 mmol/L (ref 22–32)
Calcium: 10 mg/dL (ref 8.9–10.3)
Creatinine, Ser: 1.1 mg/dL — ABNORMAL HIGH (ref 0.44–1.00)
GFR calc Af Amer: >60 mL/min (ref >60)
GFR calc non Af Amer: 56 mL/min — ABNORMAL LOW (ref >60)
GLUCOSE: 172 mg/dL — AB (ref 65–99)
POTASSIUM: 3.9 mmol/L (ref 3.5–5.1)
SODIUM: 144 mmol/L (ref 135–145)
Total Bilirubin: 0.7 mg/dL (ref 0.3–1.2)
Total Protein: 7.8 g/dL (ref 6.5–8.1)

## 2017-07-24 LAB — URINALYSIS, ROUTINE W REFLEX MICROSCOPIC
BILIRUBIN URINE: NEGATIVE
Glucose, UA: 50 mg/dL — AB
KETONES UR: NEGATIVE mg/dL
Leukocytes, UA: NEGATIVE
Nitrite: NEGATIVE
Protein, ur: 100 mg/dL — AB
Specific Gravity, Urine: 1.015 (ref 1.005–1.030)
pH: 5 (ref 5.0–8.0)

## 2017-07-24 LAB — CBC
HCT: 44.4 % (ref 36.0–46.0)
HEMOGLOBIN: 14.5 g/dL (ref 12.0–15.0)
MCH: 30.3 pg (ref 26.0–34.0)
MCHC: 32.7 g/dL (ref 30.0–36.0)
MCV: 92.7 fL (ref 78.0–100.0)
Platelets: 320 10*3/uL (ref 150–400)
RBC: 4.79 MIL/uL (ref 3.87–5.11)
RDW: 12.9 % (ref 11.5–15.5)
WBC: 11.9 10*3/uL — ABNORMAL HIGH (ref 4.0–10.5)

## 2017-07-24 LAB — LIPASE, BLOOD: LIPASE: 28 U/L (ref 11–51)

## 2017-07-24 MED ORDER — ONDANSETRON 4 MG PO TBDP
4.0000 mg | ORAL_TABLET | Freq: Once | ORAL | Status: AC
  Administered 2017-07-24: 4 mg via ORAL
  Filled 2017-07-24: qty 1

## 2017-07-24 MED ORDER — MORPHINE SULFATE (PF) 4 MG/ML IV SOLN
2.0000 mg | Freq: Once | INTRAVENOUS | Status: AC
  Administered 2017-07-24: 2 mg via INTRAVENOUS
  Filled 2017-07-24: qty 1

## 2017-07-24 MED ORDER — SODIUM CHLORIDE 0.9 % IV BOLUS
1000.0000 mL | Freq: Once | INTRAVENOUS | Status: AC
  Administered 2017-07-24: 1000 mL via INTRAVENOUS

## 2017-07-24 MED ORDER — METHOCARBAMOL 500 MG PO TABS: 500.0000 mg | ORAL_TABLET | Freq: Two times a day (BID) | ORAL | 0 refills | Status: DC

## 2017-07-24 MED ORDER — ONDANSETRON 4 MG PO TBDP: 4.0000 mg | ORAL_TABLET | Freq: Three times a day (TID) | ORAL | 0 refills | Status: DC | PRN

## 2017-07-24 NOTE — ED Notes (Signed)
Pt given crackers and PO fluids. Tolerating both well. Showing NAD. RR even and unlabored.

## 2017-07-24 NOTE — ED Triage Notes (Signed)
Pt reports that he R hip has been hurting for the past six months , worse today after tripping and falling over the dog yesterday. Denies hitting head or LOC, today has had n/v all day, denies abd pain or diarrhea.

## 2017-07-24 NOTE — ED Notes (Signed)
ED Provider at bedside. 
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
## 2018-06-28 ENCOUNTER — Emergency Department (HOSPITAL_COMMUNITY)
Admission: EM | Admit: 2018-06-28 | Discharge: 2018-06-28 | Disposition: A | Discharge status: Home / Self Care | Payer: BLUE CROSS/BLUE SHIELD | Attending: Emergency Medicine | Admitting: Emergency Medicine

## 2018-06-28 ENCOUNTER — Emergency Department (HOSPITAL_COMMUNITY): Payer: BLUE CROSS/BLUE SHIELD

## 2018-06-28 ENCOUNTER — Other Ambulatory Visit: Payer: Self-pay

## 2018-06-28 ENCOUNTER — Encounter (HOSPITAL_COMMUNITY): Payer: Self-pay | Admitting: *Deleted

## 2018-06-28 DIAGNOSIS — Y999 Unspecified external cause status: Secondary | ICD-10-CM | POA: Insufficient documentation

## 2018-06-28 DIAGNOSIS — R51 Headache: Secondary | ICD-10-CM | POA: Diagnosis not present

## 2018-06-28 DIAGNOSIS — I10 Essential (primary) hypertension: Secondary | ICD-10-CM | POA: Insufficient documentation

## 2018-06-28 DIAGNOSIS — Y9389 Activity, other specified: Secondary | ICD-10-CM | POA: Diagnosis not present

## 2018-06-28 DIAGNOSIS — Y92410 Unspecified street and highway as the place of occurrence of the external cause: Secondary | ICD-10-CM | POA: Diagnosis not present

## 2018-06-28 DIAGNOSIS — S60212A Contusion of left wrist, initial encounter: Secondary | ICD-10-CM | POA: Diagnosis not present

## 2018-06-28 DIAGNOSIS — M47812 Spondylosis without myelopathy or radiculopathy, cervical region: Secondary | ICD-10-CM

## 2018-06-28 DIAGNOSIS — Z79899 Other long term (current) drug therapy: Secondary | ICD-10-CM | POA: Diagnosis not present

## 2018-06-28 DIAGNOSIS — M542 Cervicalgia: Secondary | ICD-10-CM | POA: Diagnosis not present

## 2018-06-28 DIAGNOSIS — T07XXXA Unspecified multiple injuries, initial encounter: Secondary | ICD-10-CM

## 2018-06-28 DIAGNOSIS — S0990XA Unspecified injury of head, initial encounter: Secondary | ICD-10-CM | POA: Diagnosis present

## 2018-06-28 DIAGNOSIS — R0789 Other chest pain: Secondary | ICD-10-CM | POA: Diagnosis not present

## 2018-06-28 HISTORY — DX: Essential (primary) hypertension: I10
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
## 2020-10-31 ENCOUNTER — Encounter: Payer: Self-pay | Admitting: Emergency Medicine

## 2020-10-31 ENCOUNTER — Other Ambulatory Visit: Payer: Self-pay

## 2020-10-31 ENCOUNTER — Ambulatory Visit
Admission: EM | Admit: 2020-10-31 | Discharge: 2020-10-31 | Disposition: A | Discharge status: Home / Self Care | Payer: BC Managed Care – PPO | Attending: Emergency Medicine | Admitting: Emergency Medicine

## 2020-10-31 DIAGNOSIS — M791 Myalgia, unspecified site: Secondary | ICD-10-CM | POA: Diagnosis not present

## 2020-10-31 DIAGNOSIS — R059 Cough, unspecified: Secondary | ICD-10-CM

## 2020-10-31 DIAGNOSIS — Z20822 Contact with and (suspected) exposure to covid-19: Secondary | ICD-10-CM | POA: Diagnosis not present

## 2020-10-31 DIAGNOSIS — R519 Headache, unspecified: Secondary | ICD-10-CM | POA: Diagnosis not present

## 2020-10-31 DIAGNOSIS — U071 COVID-19: Secondary | ICD-10-CM

## 2020-10-31 HISTORY — DX: Pure hypercholesterolemia, unspecified: E78.00

## 2020-10-31 HISTORY — DX: Gastro-esophageal reflux disease without esophagitis: K21.9

## 2020-10-31 MED ORDER — HYDROCOD POLST-CPM POLST ER 10-8 MG/5ML PO SUER: 5.0000 mL | Freq: Two times a day (BID) | ORAL | 0 refills | Status: DC | PRN

## 2020-10-31 MED ORDER — BENZONATATE 200 MG PO CAPS: 200.0000 mg | ORAL_CAPSULE | Freq: Three times a day (TID) | ORAL | 0 refills | Status: DC | PRN

## 2020-10-31 MED ORDER — FLUTICASONE PROPIONATE 50 MCG/ACT NA SUSP: 2.0000 | Freq: Every day | NASAL | 0 refills | Status: AC

## 2020-10-31 NOTE — ED Triage Notes (Signed)
Sore throat, congested, body aches, decreased taste that started Sunday night.  Neg at home covid test taken Sunday night.  Has been exposed to covid recently.

## 2020-10-31 NOTE — ED Provider Notes (Signed)
HPI  SUBJECTIVE:  Bianca Payne is a 55 y.o. female who presents with 2 days of malaise, body aches, headaches, nasal congestion, sore throat, decreased smell, loss of sense of taste.  States that she feels feverish, but does not have a thermometer at home.  She reports a cough productive of mucus, nausea, diarrhea.  She was exposed to COVID 5 days ago.  She got the [employer] COVID-vaccine 2 years ago.  No rhinorrhea, postnasal drip, vomiting, abdominal pain.  She had a negative home COVID test 2 days ago.  No antipyretic in the past 6 hours.  She tried 1000 mg of Tylenol without improvement in her symptoms.  Symptoms worse with breathing, coughing.  States that she is unable to sleep at night secondary to the cough.  She has a past medical history of hypertension, BMI above 30, IBS on a DMARD, hypercholesterolemia.  KGS:[id number], Hollice Espy, MD   Past Medical History:  Diagnosis Date   Fibromyalgia    GERD (gastroesophageal reflux disease)    High cholesterol    Hypertension    IBS (irritable bowel syndrome)    UTI (lower urinary tract infection)     Past Surgical History:  Procedure Laterality Date   colonscopy      Family History  Problem Relation Age of Onset   Breast cancer Maternal Grandmother     Social History   Tobacco Use   Smoking status: Never   Smokeless tobacco: Never  Substance Use Topics   Alcohol use: No   Drug use: No    No current facility-administered medications for this encounter.  Current Outpatient Medications:    amLODipine (NORVASC) 10 MG tablet, Take 10 mg by mouth daily., Disp: , Rfl: 2   benzonatate (TESSALON) 200 MG capsule, Take 1 capsule (200 mg total) by mouth 3 (three) times daily as needed for cough., Disp: 30 capsule, Rfl: 0   cholecalciferol (VITAMIN D3) 25 MCG (1000 UNIT) tablet, Take by mouth daily. Unsure of mg, Disp: , Rfl:    Esomeprazole Magnesium 20 MG TBEC, Take 20 mg by mouth daily., Disp: , Rfl:    ferrous sulfate 325 (65 FE) MG EC  tablet, Take 325 mg by mouth at bedtime., Disp: , Rfl:    fluticasone (FLONASE) 50 MCG/ACT nasal spray, Place 2 sprays into both nostrils daily., Disp: 16 g, Rfl: 0   glucosamine-chondroitin 500-400 MG tablet, Take 1 tablet by mouth 2 (two) times daily., Disp: , Rfl:    magnesium 30 MG tablet, Take by mouth daily. Unsure of mg, Disp: , Rfl:    meloxicam (MOBIC) 7.5 MG tablet, Take 7.5 mg by mouth 2 (two) times daily., Disp: , Rfl: 3   pantoprazole (PROTONIX) 20 MG tablet, Take 20 mg by mouth daily., Disp: , Rfl:    pravastatin (PRAVACHOL) 40 MG tablet, Take 40 mg by mouth every evening., Disp: , Rfl: 4   sulfaSALAzine (AZULFIDINE) 500 MG tablet, Take 500-1,000 mg by mouth 2 (two) times daily. , Disp: , Rfl:    chlorpheniramine-HYDROcodone (TUSSIONEX PENNKINETIC ER) 10-8 MG/5ML SUER, Take 5 mLs by mouth every 12 (twelve) hours as needed for cough., Disp: 70 mL, Rfl: 0   clidinium-chlordiazePOXIDE (LIBRAX) 2.5-5 MG per capsule, Take 1 capsule by mouth 3 (three) times daily before meals. , Disp: , Rfl:    DULoxetine (CYMBALTA) 30 MG capsule, Take 30 mg by mouth daily., Disp: , Rfl: 3   methocarbamol (ROBAXIN) 500 MG tablet, Take 1 tablet (500 mg total) by mouth 2 (two)  times daily., Disp: 20 tablet, Rfl: 0   ondansetron (ZOFRAN ODT) 4 MG disintegrating tablet, Take 1 tablet (4 mg total) by mouth every 8 (eight) hours as needed for nausea or vomiting., Disp: 3 tablet, Rfl: 0   pregabalin (LYRICA) 100 MG capsule, Take 100 mg by mouth 3 (three) times daily., Disp: , Rfl:   No Known Allergies   ROS  As noted in HPI.   Physical Exam  BP 132/90 (BP Location: Right Arm)   Pulse (!) 112   Temp 99.5 F (37.5 C) (Oral)   Resp 19   LMP 12/18/2012   SpO2 97%   Constitutional: Well developed, well nourished, appears ill Eyes:  EOMI, conjunctiva normal bilaterally HENT: Normocephalic, atraumatic,mucus membranes moist.  Erythematous, swollen turbinates.  Clear nasal congestion.  Positive maxillary,  frontal sinus tenderness.  Normal tonsils.  No obvious postnasal drip. Neck: No cervical adenopathy Respiratory: Normal inspiratory effort, lungs clear bilaterally Cardiovascular: Regular tachycardia, no murmurs rubs or gallops GI: nondistended skin: No rash, skin intact Musculoskeletal: no deformities Neurologic: Alert & oriented x 3, no focal neuro deficits Psychiatric: Speech and behavior appropriate   ED Course   Medications - No data to display  Orders Placed This Encounter  Procedures   Novel Coronavirus, NAA ([facility])    Standing Status:   Standing    Number of Occurrences:   1    Order Specific Question:   Patient immune status    Answer:   Normal    Order Specific Question:   Release to patient    Answer:   Immediate   SARS-COV-2, NAA 2 DAY TAT    Standing Status:   Standing    Number of Occurrences:   1   Results for orders placed or performed during the hospital encounter of 10/31/20  Novel Coronavirus, NAA ([facility])   Specimen: Nasopharyngeal(NP) swabs in vial transport medium   Nasopharynge  Patient  Result Value Ref Range   SARS-CoV-2, NAA Detected (A) Not Detected  SARS-COV-2, NAA 2 DAY TAT   Nasopharynge  Patient  Result Value Ref Range   SARS-CoV-2, NAA 2 DAY TAT Performed   Basic metabolic panel  Result Value Ref Range   Glucose 178 (H) 65 - 99 mg/dL   BUN 9 6 - 24 mg/dL   Creatinine, Ser 0.77 0.57 - 1.00 mg/dL   eGFR 90 >59 mL/min/1.73   BUN/Creatinine Ratio 12 9 - 23   Sodium 142 134 - 144 mmol/L   Potassium 4.3 3.5 - 5.2 mmol/L   Chloride 103 96 - 106 mmol/L   CO2 26 20 - 29 mmol/L   Calcium 9.7 8.7 - 10.2 mg/dL    No results found for this or any previous visit (from the past 24 hour(s)). No results found.  ED Clinical Impression  1. COVID-19 virus infection   2. Cough   3. Encounter for laboratory testing for COVID-19 virus   4. Acute nonintractable headache, unspecified headache type   5. Myalgia      ED Assessment/Plan  [facility] narcotic database reviewed.  No opiate prescriptions in 2 years  Patient with acute illness with systemic symptoms of tachycardia. patient will be a candidate for antivirals due to hypertension, on DMARD, and she is not fully vaccinated.  We will check a BMP and prescribe Paxlovid COVID is positive.  In the meantime, Tessalon, Flonase, saline nasal irrigation Tylenol, Tussionex for the cough at night, Mucinex.  Work note for 3 days.  Follow-up with PMD  as needed.  To the ER for any trouble breathing  Phone number [phone]  BMP reviewed. Hyperglycemia at 178. GFR 90.  Sent patient message regarding hyperglycemia and advised her to have it rechecked by PMD when she is feeling better.  Discussed positive COVID result with patient.  E prescribed Paxlovid to pharmacy on record.  See telephone note.  Discussed what medications to stop while taking the Paxlovid.   Discussed labs, MDM, treatment plan, and plan for follow-up with patient. Discussed sn/sx that should prompt return to the ED. patient agrees with plan.   Meds ordered this encounter  Medications   fluticasone (FLONASE) 50 MCG/ACT nasal spray    Sig: Place 2 sprays into both nostrils daily.    Dispense:  16 g    Refill:  0   DISCONTD: chlorpheniramine-HYDROcodone (TUSSIONEX PENNKINETIC ER) 10-8 MG/5ML SUER    Sig: Take 5 mLs by mouth every 12 (twelve) hours as needed for cough.    Dispense:  60 mL    Refill:  0   benzonatate (TESSALON) 200 MG capsule    Sig: Take 1 capsule (200 mg total) by mouth 3 (three) times daily as needed for cough.    Dispense:  30 capsule    Refill:  0   chlorpheniramine-HYDROcodone (TUSSIONEX PENNKINETIC ER) 10-8 MG/5ML SUER    Sig: Take 5 mLs by mouth every 12 (twelve) hours as needed for cough.    Dispense:  70 mL    Refill:  0      *This clinic note was created using [employer]. Therefore, there may be occasional mistakes despite careful proofreading.  ?    Melynda Ripple, MD 11/01/20 [phone]

## 2020-10-31 NOTE — Discharge Instructions (Addendum)
Take 1000 mg of Tylenol 3-4 times a day as needed for body aches, headaches, fevers.  Saline nasal irrigation with a [facility] Med rinse and distilled water as often as you want, Mucinex, Flonase, Tessalon for the cough during the day.  Tussionex for the cough at night.  If your COVID is positive and I start you on Paxlovid do not take the Flonase.

## 2020-11-01 ENCOUNTER — Encounter (HOSPITAL_COMMUNITY): Payer: Self-pay | Admitting: Emergency Medicine

## 2020-11-01 ENCOUNTER — Telehealth (HOSPITAL_COMMUNITY): Payer: Self-pay | Admitting: Emergency Medicine

## 2020-11-01 DIAGNOSIS — U071 COVID-19: Secondary | ICD-10-CM

## 2020-11-01 LAB — BASIC METABOLIC PANEL
BUN/Creatinine Ratio: 12 (ref 9–23)
BUN: 9 mg/dL (ref 6–24)
CO2: 26 mmol/L (ref 20–29)
Calcium: 9.7 mg/dL (ref 8.7–10.2)
Chloride: 103 mmol/L (ref 96–106)
Creatinine, Ser: 0.77 mg/dL (ref 0.57–1.00)
Glucose: 178 mg/dL — ABNORMAL HIGH (ref 65–99)
Potassium: 4.3 mmol/L (ref 3.5–5.2)
Sodium: 142 mmol/L (ref 134–144)
eGFR: 90 mL/min/{1.73_m2} (ref >59)

## 2020-11-01 LAB — NOVEL CORONAVIRUS, NAA: SARS-CoV-2, NAA: DETECTED — AB

## 2020-11-01 LAB — SARS-COV-2, NAA 2 DAY TAT

## 2020-11-01 MED ORDER — NIRMATRELVIR/RITONAVIR (PAXLOVID)TABLET: 3.0000 | ORAL_TABLET | Freq: Two times a day (BID) | ORAL | 0 refills | Status: AC

## 2020-11-01 NOTE — Telephone Encounter (Signed)
Results for orders placed or performed during the hospital encounter of 10/31/20  Novel Coronavirus, NAA ([facility])   Specimen: Nasopharyngeal(NP) swabs in vial transport medium   Nasopharynge  Patient  Result Value Ref Range   SARS-CoV-2, NAA Detected (A) Not Detected  SARS-COV-2, NAA 2 DAY TAT   Nasopharynge  Patient  Result Value Ref Range   SARS-CoV-2, NAA 2 DAY TAT Performed   Basic metabolic panel  Result Value Ref Range   Glucose 178 (H) 65 - 99 mg/dL   BUN 9 6 - 24 mg/dL   Creatinine, Ser 0.77 0.57 - 1.00 mg/dL   eGFR 90 >59 mL/min/1.73   BUN/Creatinine Ratio 12 9 - 23   Sodium 142 134 - 144 mmol/L   Potassium 4.3 3.5 - 5.2 mmol/L   Chloride 103 96 - 106 mmol/L   CO2 26 20 - 29 mmol/L   Calcium 9.7 8.7 - 10.2 mg/dL   Patient COVID-positive.  GFR 90.  Will eprescribe Paxlovid into the pharmacy on record.  Discussed positive COVID result with patient.  Discontinue Flonase while taking the Paxlovid she is currently not taking Librax.  Advised her to try not to take the Tussionex while taking the Paxlovid.  Discussed signs and symptoms that should prompt return to the emergency department.  Patient agrees with plan.

## 2021-06-19 ENCOUNTER — Other Ambulatory Visit (HOSPITAL_COMMUNITY)
Admission: RE | Admit: 2021-06-19 | Discharge: 2021-06-19 | Disposition: A | Discharge status: Home / Self Care | Payer: BC Managed Care – PPO | Source: Ambulatory Visit | Attending: Obstetrics & Gynecology | Admitting: Obstetrics & Gynecology

## 2021-06-19 ENCOUNTER — Encounter: Payer: Self-pay | Admitting: Obstetrics & Gynecology

## 2021-06-19 ENCOUNTER — Ambulatory Visit (INDEPENDENT_AMBULATORY_CARE_PROVIDER_SITE_OTHER): Payer: BC Managed Care – PPO | Admitting: Obstetrics & Gynecology

## 2021-06-19 VITALS — BP 121/77 | HR 98 | Ht 63.0 in | Wt 184.0 lb

## 2021-06-19 DIAGNOSIS — Z1231 Encounter for screening mammogram for malignant neoplasm of breast: Secondary | ICD-10-CM

## 2021-06-19 DIAGNOSIS — Z01419 Encounter for gynecological examination (general) (routine) without abnormal findings: Secondary | ICD-10-CM | POA: Diagnosis present

## 2021-06-19 NOTE — Progress Notes (Signed)
Subjective:  ?  ? Bianca Payne is a 56 y.o. female here for a routine exam.  Patient's last menstrual period was 12/18/2012. [id number] ?Birth Control Method:  menopausal ?Menstrual Calendar(currently): amenorrheic  ?Current complaints: no specific gyn complaints.  ? ?Current acute medical issues:  none ?  ?Recent Gynecologic History ?Patient's last menstrual period was 12/18/2012. ?Last Pap: 2014,  normal ?Last mammogram: 2014,  normal ? ?Past Medical History:  ?Diagnosis Date  ? Fibromyalgia   ? GERD (gastroesophageal reflux disease)   ? High cholesterol   ? Hypertension   ? IBS (irritable bowel syndrome)   ? UTI (lower urinary tract infection)   ? ? ?Past Surgical History:  ?Procedure Laterality Date  ? colonscopy    ? ? ?OB History   ? ? Gravida  ?3  ? Para  ?3  ? Term  ?3  ? Preterm  ?   ? AB  ?   ? Living  ?3  ?  ? ? SAB  ?   ? IAB  ?   ? Ectopic  ?   ? Multiple  ?   ? Live Births  ?3  ?   ?  ?  ? ? ?Social History  ? ?Socioeconomic History  ? Marital status: Divorced  ?  Spouse name: Not on file  ? Number of children: Not on file  ? Years of education: Not on file  ? Highest education level: Not on file  ?Occupational History  ? Not on file  ?Tobacco Use  ? Smoking status: Never  ? Smokeless tobacco: Never  ?Vaping Use  ? Vaping Use: Never used  ?Substance and Sexual Activity  ? Alcohol use: No  ? Drug use: No  ? Sexual activity: Not Currently  ?  Birth control/protection: Post-menopausal  ?Other Topics Concern  ? Not on file  ?Social History Narrative  ? Not on file  ? ?Social Determinants of Health  ? ?Financial Resource Strain: Low Risk   ? Difficulty of Paying Living Expenses: Not very hard  ?Food Insecurity: No Food Insecurity  ? Worried About Charity fundraiser in the Last Year: Never true  ? Ran Out of Food in the Last Year: Never true  ?Transportation Needs: No Transportation Needs  ? Lack of Transportation (Medical): No  ? Lack of Transportation (Non-Medical): No  ?Physical Activity: Insufficiently  Active  ? Days of Exercise per Week: 2 days  ? Minutes of Exercise per Session: 20 min  ?Stress: No Stress Concern Present  ? Feeling of Stress : Only a little  ?Social Connections: Moderately Isolated  ? Frequency of Communication with Friends and Family: More than three times a week  ? Frequency of Social Gatherings with Friends and Family: Once a week  ? Attends Religious Services: More than 4 times per year  ? Active Member of Clubs or Organizations: No  ? Attends Archivist Meetings: Never  ? Marital Status: Divorced  ? ? ?Family History  ?Problem Relation Age of Onset  ? Heart failure Paternal Grandmother   ? Breast cancer Maternal Grandmother   ? Prostate cancer Maternal Grandfather   ? Bone cancer Father   ? Heart disease Mother   ? ? ? ?Current Outpatient Medications:  ?  amLODipine (NORVASC) 10 MG tablet, Take 10 mg by mouth daily., Disp: , Rfl: 2 ?  atorvastatin (LIPITOR) 20 MG tablet, Take 20 mg by mouth at bedtime., Disp: , Rfl:  ?  cholecalciferol (VITAMIN D3) 25  MCG (1000 UNIT) tablet, Take by mouth daily. Unsure of mg, Disp: , Rfl:  ?  clidinium-chlordiazePOXIDE (LIBRAX) 2.5-5 MG per capsule, Take 1 capsule by mouth 3 (three) times daily before meals. , Disp: , Rfl:  ?  DULoxetine (CYMBALTA) 60 MG capsule, Take 60 mg by mouth daily., Disp: , Rfl:  ?  Esomeprazole Magnesium 20 MG TBEC, Take 20 mg by mouth daily., Disp: , Rfl:  ?  famotidine (PEPCID) 20 MG tablet, Take by mouth., Disp: , Rfl:  ?  ferrous sulfate 325 (65 FE) MG EC tablet, Take 325 mg by mouth at bedtime., Disp: , Rfl:  ?  fluticasone (FLONASE) 50 MCG/ACT nasal spray, Place 2 sprays into both nostrils daily., Disp: 16 g, Rfl: 0 ?  glucosamine-chondroitin 500-400 MG tablet, Take 1 tablet by mouth 2 (two) times daily., Disp: , Rfl:  ?  magnesium 30 MG tablet, Take by mouth daily. Unsure of mg, Disp: , Rfl:  ?  meloxicam (MOBIC) 7.5 MG tablet, Take 7.5 mg by mouth 2 (two) times daily., Disp: , Rfl: 3 ?  metFORMIN (GLUCOPHAGE-XR)  500 MG 24 hr tablet, Take 500 mg by mouth every morning., Disp: , Rfl:  ?  pantoprazole (PROTONIX) 40 MG tablet, Take 40 mg by mouth daily., Disp: , Rfl:  ?  pravastatin (PRAVACHOL) 40 MG tablet, Take 40 mg by mouth every evening., Disp: , Rfl: 4 ?  pregabalin (LYRICA) 100 MG capsule, Take 100 mg by mouth 3 (three) times daily., Disp: , Rfl:  ?  propranolol (INDERAL) 10 MG tablet, Take 10 mg by mouth daily., Disp: , Rfl:  ?  sulfaSALAzine (AZULFIDINE) 500 MG tablet, Take 500-1,000 mg by mouth 2 (two) times daily. , Disp: , Rfl:  ? ?Review of Systems ? ?Review of Systems  ?Constitutional: Negative for fever, chills, weight loss, malaise/fatigue and diaphoresis.  ?HENT: Negative for hearing loss, ear pain, nosebleeds, congestion, sore throat, neck pain, tinnitus and ear discharge.   ?Eyes: Negative for blurred vision, double vision, photophobia, pain, discharge and redness.  ?Respiratory: Negative for cough, hemoptysis, sputum production, shortness of breath, wheezing and stridor.   ?Cardiovascular: Negative for chest pain, palpitations, orthopnea, claudication, leg swelling and PND.  ?Gastrointestinal: negative for abdominal pain. Negative for heartburn, nausea, vomiting, diarrhea, constipation, blood in stool and melena.  ?Genitourinary: Negative for dysuria, urgency, frequency, hematuria and flank pain.  ?Musculoskeletal: Negative for myalgias, back pain, joint pain and falls.  ?Skin: Negative for itching and rash.  ?Neurological: Negative for dizziness, tingling, tremors, sensory change, speech change, focal weakness, seizures, loss of consciousness, weakness and headaches.  ?Endo/Heme/Allergies: Negative for environmental allergies and polydipsia. Does not bruise/bleed easily.  ?Psychiatric/Behavioral: Negative for depression, suicidal ideas, hallucinations, memory loss and substance abuse. The patient is not nervous/anxious and does not have insomnia.   ? ?  ?  ?Objective:  ?Blood pressure 121/77, pulse 98,  height 5\' 3"  (1.6 m), weight 184 lb (83.5 kg), last menstrual period 12/18/2012.  ? Physical Exam  ?Vitals reviewed. ?Constitutional: She is oriented to person, place, and time. She appears well-developed and well-nourished.  ?HENT:  ?Head: Normocephalic and atraumatic.        ?Right Ear: External ear normal.  ?Left Ear: External ear normal.  ?Nose: Nose normal.  ?Mouth/Throat: Oropharynx is clear and moist.  ?Eyes: Conjunctivae and EOM are normal. Pupils are equal, round, and reactive to light. Right eye exhibits no discharge. Left eye exhibits no discharge. No scleral icterus.  ?Neck: Normal range of motion. Neck  supple. No tracheal deviation present. No thyromegaly present.  ?Cardiovascular: Normal rate, regular rhythm, normal heart sounds and intact distal pulses.  Exam reveals no gallop and no friction rub.   ?No murmur heard. ?Respiratory: Effort normal and breath sounds normal. No respiratory distress. She has no wheezes. She has no rales. She exhibits no tenderness.  ?GI: Soft. Bowel sounds are normal. She exhibits no distension and no mass. There is no tenderness. There is no rebound and no guarding.  ?Genitourinary:  ?Breasts no masses skin changes or nipple changes bilaterally ?     Vulva is normal without lesions ?Vagina is pink moist without discharge ?Cervix normal in appearance and pap is done ?Uterus is normal size shape and contour ?Adnexa is negative with normal sized ovaries  ? ?Musculoskeletal: Normal range of motion. She exhibits no edema and no tenderness.  ?Neurological: She is alert and oriented to person, place, and time. She has normal reflexes. She displays normal reflexes. No cranial nerve deficit. She exhibits normal muscle tone. Coordination normal.  ?Skin: Skin is warm and dry. No rash noted. No erythema. No pallor.  ?Psychiatric: She has a normal mood and affect. Her behavior is normal. Judgment and thought content normal.  ? ?   ? ?Medications Ordered at today's visit: ?No orders of  the defined types were placed in this encounter. ? ? ?Other orders placed at today's visit: ?Orders Placed This Encounter  ?Procedures  ? MM 3D SCREEN BREAST BILATERAL  ? ? ? ? ?Assessment:  ? ? Normal Gyn e

## 2021-06-21 LAB — CYTOLOGY - PAP
Comment: NEGATIVE
Diagnosis: NEGATIVE
High risk HPV: NEGATIVE

## 2021-11-06 ENCOUNTER — Ambulatory Visit
Admission: EM | Admit: 2021-11-06 | Discharge: 2021-11-06 | Disposition: A | Discharge status: Home / Self Care | Payer: BC Managed Care – PPO | Attending: Nurse Practitioner | Admitting: Nurse Practitioner

## 2021-11-06 DIAGNOSIS — R059 Cough, unspecified: Secondary | ICD-10-CM | POA: Insufficient documentation

## 2021-11-06 DIAGNOSIS — J069 Acute upper respiratory infection, unspecified: Secondary | ICD-10-CM | POA: Insufficient documentation

## 2021-11-06 DIAGNOSIS — Z20822 Contact with and (suspected) exposure to covid-19: Secondary | ICD-10-CM | POA: Insufficient documentation

## 2021-11-06 MED ORDER — PROMETHAZINE-DM 6.25-15 MG/5ML PO SYRP: 5.0000 mL | ORAL_SOLUTION | Freq: Every evening | ORAL | 0 refills | Status: AC | PRN

## 2021-11-06 MED ORDER — BENZONATATE 100 MG PO CAPS: 100.0000 mg | ORAL_CAPSULE | Freq: Three times a day (TID) | ORAL | 0 refills | Status: AC | PRN

## 2021-11-06 NOTE — Discharge Instructions (Addendum)
Your symptoms and exam findings are most consistent with a viral upper respiratory infection. These usually run their course in about 10 days.  If your symptoms last longer than 10 days without improvement, please follow up with your primary care provider.  If your symptoms, worsen, please go to the Emergency Room.    We have tested you today for COVID-19.  You will see the results in Mychart and we will call you with positive results.    Please stay home and isolate until you are aware of the results.    Some things that can make you feel better are: - Increased rest - Increasing fluid with water/sugar free electrolytes - Acetaminophen and ibuprofen as needed for fever/pain.  - Salt water gargling, chloraseptic spray and throat lozenges - OTC guaifenesin (Mucinex).  - Saline sinus flushes or a neti pot.  - Humidifying the air. - Cough syrup at night time for dry cough.  Cough perles during the day for dry cough.  Do not take either of these with alcohol, other sedating medication, or while driving or operating heavy machinery

## 2021-11-06 NOTE — ED Triage Notes (Signed)
Pt reports nasal congestion, crusty eyes, crusty eras, neck pain, swelling lymph nodes in neck x 2-3 days.

## 2021-11-06 NOTE — ED Provider Notes (Signed)
[facility]    CSN: [id number] Arrival date & time: 11/06/21  1200      History   Chief Complaint Chief Complaint  Patient presents with   neck problem   Nasal Congestion        Eye Problem    HPI Bianca Payne is a 56 y.o. female.   Patient presents with 2 to 3 days of nasal congestion, swollen lymph nodes, crusty nose, crusty ears, dry cough, slight sore throat.  Also endorses green right ear drainage.  She is worried that the neck pain is not improving and she feels like it is closing in on itself.  She denies fever, body aches/chills, shortness of breath, wheezing, chest pain or tightness.  No abdominal pain, nausea/vomiting, diarrhea, decreased appetite.  No new rash.  Reports she is more tired than normal.  Has taken over-the-counter cold medicine without relief.     Past Medical History:  Diagnosis Date   Fibromyalgia    GERD (gastroesophageal reflux disease)    High cholesterol    Hypertension    IBS (irritable bowel syndrome)    UTI (lower urinary tract infection)     Patient Active Problem List   Diagnosis Date Noted   Trichomonal vaginitis 02/02/2013    Past Surgical History:  Procedure Laterality Date   colonscopy      OB History     Gravida  3   Para  3   Term  3   Preterm      AB      Living  3      SAB      IAB      Ectopic      Multiple      Live Births  3            Home Medications    Prior to Admission medications   Medication Sig Start Date End Date Taking? Authorizing Provider  benzonatate (TESSALON) 100 MG capsule Take 1 capsule (100 mg total) by mouth 3 (three) times daily as needed for cough. Do not take with alcohol or while driving or operating heavy machinery 11/06/21  Yes Valentino Nose, NP  promethazine-dextromethorphan (PROMETHAZINE-DM) 6.25-15 MG/5ML syrup Take 5 mLs by mouth at bedtime as needed for cough. Do not take with alcohol or while driving or operating heavy machinery 11/06/21   Yes Cathlean Marseilles A, NP  amLODipine (NORVASC) 10 MG tablet Take 10 mg by mouth daily. 06/03/17   [provider]  atorvastatin (LIPITOR) 20 MG tablet Take 20 mg by mouth at bedtime. 06/07/21   [provider]  cholecalciferol (VITAMIN D3) 25 MCG (1000 UNIT) tablet Take by mouth daily. Unsure of mg    [provider]  clidinium-chlordiazePOXIDE (LIBRAX) 2.5-5 MG per capsule Take 1 capsule by mouth 3 (three) times daily before meals.     [provider]  DULoxetine (CYMBALTA) 60 MG capsule Take 60 mg by mouth daily. 06/07/21   [provider]  Esomeprazole Magnesium 20 MG TBEC Take 20 mg by mouth daily. 05/25/15   [provider]  famotidine (PEPCID) 20 MG tablet Take by mouth. 12/07/20   [provider]  ferrous sulfate 325 (65 FE) MG EC tablet Take 325 mg by mouth at bedtime.    [provider]  fluticasone (FLONASE) 50 MCG/ACT nasal spray Place 2 sprays into both nostrils daily. 10/31/20   Domenick Gong, MD  glucosamine-chondroitin 500-400 MG tablet Take 1 tablet by mouth 2 (two)  times daily.    [provider]  magnesium 30 MG tablet Take by mouth daily. Unsure of mg    [provider]  meloxicam (MOBIC) 7.5 MG tablet Take 7.5 mg by mouth 2 (two) times daily. 07/18/17   [provider]  metFORMIN (GLUCOPHAGE-XR) 500 MG 24 hr tablet Take 500 mg by mouth every morning. 05/24/21   [provider]  pantoprazole (PROTONIX) 40 MG tablet Take 40 mg by mouth daily. 04/01/21   [provider]  pravastatin (PRAVACHOL) 40 MG tablet Take 40 mg by mouth every evening. 07/08/17   [provider]  pregabalin (LYRICA) 100 MG capsule Take 100 mg by mouth 3 (three) times daily. 05/06/17   [provider]  propranolol (INDERAL) 10 MG tablet Take 10 mg by mouth daily. 06/07/21   [provider]  sulfaSALAzine (AZULFIDINE) 500 MG tablet Take 500-1,000 mg by mouth 2 (two) times  daily.  05/22/16   [provider]    Family History Family History  Problem Relation Age of Onset   Heart failure Paternal Grandmother    Breast cancer Maternal Grandmother    Prostate cancer Maternal Grandfather    Bone cancer Father    Heart disease Mother     Social History Social History   Tobacco Use   Smoking status: Never   Smokeless tobacco: Never  Vaping Use   Vaping Use: Never used  Substance Use Topics   Alcohol use: No   Drug use: No     Allergies   Patient has no known allergies.   Review of Systems Review of Systems Per HPI  Physical Exam Triage Vital Signs ED Triage Vitals  Enc Vitals Group     BP 11/06/21 1217 134/82     Pulse Rate 11/06/21 1217 91     Resp 11/06/21 1217 16     Temp 11/06/21 1217 98 F (36.7 C)     Temp Source 11/06/21 1217 Oral     SpO2 11/06/21 1217 96 %     Weight --      Height --      Head Circumference --      Peak Flow --      Pain Score 11/06/21 1218 9     Pain Loc --      Pain Edu? --      Excl. in GC? --    No data found.  Updated Vital Signs BP 134/82 (BP Location: Right Arm)   Pulse 91   Temp 98 F (36.7 C) (Oral)   Resp 16   LMP 12/18/2012   SpO2 96%   Visual Acuity Right Eye Distance:   Left Eye Distance:   Bilateral Distance:    Right Eye Near:   Left Eye Near:    Bilateral Near:     Physical Exam Vitals and nursing note reviewed.  Constitutional:      General: She is not in acute distress.    Appearance: Normal appearance. She is not ill-appearing or toxic-appearing.  HENT:     Head: Normocephalic and atraumatic.     Right Ear: Tympanic membrane, ear canal and external ear normal.     Left Ear: Tympanic membrane, ear canal and external ear normal.     Nose: Congestion and rhinorrhea present.     Mouth/Throat:     Mouth: Mucous membranes are moist.     Pharynx: Oropharynx is clear. Posterior oropharyngeal erythema present. No oropharyngeal exudate.     Tonsils: No  tonsillar  exudate or tonsillar abscesses. 1+ on the right. 1+ on the left.  Eyes:     General: No scleral icterus.    Extraocular Movements: Extraocular movements intact.  Cardiovascular:     Rate and Rhythm: Normal rate and regular rhythm.  Pulmonary:     Effort: Pulmonary effort is normal. No respiratory distress.     Breath sounds: Normal breath sounds. No wheezing, rhonchi or rales.  Abdominal:     General: Abdomen is flat.  Musculoskeletal:     Cervical back: Normal range of motion and neck supple.  Lymphadenopathy:     Cervical: Cervical adenopathy present.  Skin:    General: Skin is warm and dry.     Coloration: Skin is not jaundiced or pale.     Findings: No erythema or rash.  Neurological:     Mental Status: She is alert and oriented to person, place, and time.  Psychiatric:        Behavior: Behavior is cooperative.      UC Treatments / Results  Labs (all labs ordered are listed, but only abnormal results are displayed) Labs Reviewed  SARS CORONAVIRUS 2 (TAT 6-24 HRS)    EKG   Radiology No results found.  Procedures Procedures (including critical care time)  Medications Ordered in UC Medications - No data to display  Initial Impression / Assessment and Plan / UC Course  I have reviewed the triage vital signs and the nursing notes.  Pertinent labs & imaging results that were available during my care of the patient were reviewed by me and considered in my medical decision making (see chart for details).    Patient is a well-appearing 56 year old female presenting for nasal congestion, swollen lymph nodes, and dry cough.  In triage, she is normotensive, not tachycardic, not tachypneic, oxygenating well on room air, and afebrile.  Discussed high likelihood for viral illness given symptoms.  COVID-19 testing obtained.  Reports she had a reaction to the Paxlovid from last year, otherwise would be a good candidate for antiviral therapy if she is positive.  Supportive care  discussed.  Note given for work.  ER precautions discussed.  Seek care if symptoms persist more than 10 days without improvement.  The patient was given the opportunity to ask questions.  All questions answered to their satisfaction.  The patient is in agreement to this plan.   Final Clinical Impressions(s) / UC Diagnoses   Final diagnoses:  Viral URI with cough     Discharge Instructions      Your symptoms and exam findings are most consistent with a viral upper respiratory infection. These usually run their course in about 10 days.  If your symptoms last longer than 10 days without improvement, please follow up with your primary care provider.  If your symptoms, worsen, please go to the Emergency Room.    We have tested you today for COVID-19.  You will see the results in Mychart and we will call you with positive results.    Please stay home and isolate until you are aware of the results.    Some things that can make you feel better are: - Increased rest - Increasing fluid with water/sugar free electrolytes - Acetaminophen and ibuprofen as needed for fever/pain.  - Salt water gargling, chloraseptic spray and throat lozenges - OTC guaifenesin (Mucinex).  - Saline sinus flushes or a neti pot.  - Humidifying the air. - Cough syrup at night time for dry cough.  Cough perles  during the day for dry cough.  Do not take either of these with alcohol, other sedating medication, or while driving or operating heavy machinery    ED Prescriptions     Medication Sig Dispense Auth. Provider   promethazine-dextromethorphan (PROMETHAZINE-DM) 6.25-15 MG/5ML syrup Take 5 mLs by mouth at bedtime as needed for cough. Do not take with alcohol or while driving or operating heavy machinery 118 mL Cathlean Marseilles A, NP   benzonatate (TESSALON) 100 MG capsule Take 1 capsule (100 mg total) by mouth 3 (three) times daily as needed for cough. Do not take with alcohol or while driving or operating heavy  machinery 21 capsule Valentino Nose, NP      PDMP not reviewed this encounter.   Valentino Nose, NP 11/06/21 1258

## 2021-11-07 LAB — SARS CORONAVIRUS 2 (TAT 6-24 HRS): SARS Coronavirus 2: NEGATIVE

## 2022-04-02 ENCOUNTER — Encounter (HOSPITAL_COMMUNITY): Payer: Self-pay | Admitting: Adult Health Nurse Practitioner

## 2022-04-02 ENCOUNTER — Other Ambulatory Visit (HOSPITAL_COMMUNITY): Payer: Self-pay | Admitting: Adult Health Nurse Practitioner

## 2022-04-02 DIAGNOSIS — Z1231 Encounter for screening mammogram for malignant neoplasm of breast: Secondary | ICD-10-CM

## 2022-04-08 ENCOUNTER — Ambulatory Visit (HOSPITAL_COMMUNITY)
Admission: RE | Admit: 2022-04-08 | Discharge: 2022-04-08 | Disposition: A | Discharge status: Home / Self Care | Payer: BC Managed Care – PPO | Source: Ambulatory Visit | Attending: Adult Health Nurse Practitioner | Admitting: Adult Health Nurse Practitioner

## 2022-04-08 ENCOUNTER — Encounter (HOSPITAL_COMMUNITY): Payer: Self-pay

## 2022-04-08 DIAGNOSIS — Z1231 Encounter for screening mammogram for malignant neoplasm of breast: Secondary | ICD-10-CM

## 2022-04-18 ENCOUNTER — Encounter (HOSPITAL_COMMUNITY): Payer: Self-pay

## 2022-11-05 ENCOUNTER — Ambulatory Visit
Admission: EM | Admit: 2022-11-05 | Discharge: 2022-11-05 | Disposition: A | Discharge status: Home / Self Care | Payer: BC Managed Care – PPO | Attending: Nurse Practitioner | Admitting: Nurse Practitioner

## 2022-11-05 DIAGNOSIS — M25561 Pain in right knee: Secondary | ICD-10-CM

## 2022-11-05 MED ORDER — DICLOFENAC SODIUM 2 % EX SOLN: 2.0000 | Freq: Two times a day (BID) | CUTANEOUS | 0 refills | Status: AC | PRN

## 2022-11-05 NOTE — ED Provider Notes (Signed)
[facility]    CSN: [id number] Arrival date & time: 11/05/22  1057      History   Chief Complaint Chief Complaint  Patient presents with   Knee Pain    HPI Bianca Payne is a 57 y.o. female.   The history is provided by the patient.   The patient presents for complaints of right knee pain.  Patient states symptoms have been present for more than 1 month.  She states over the past 24 hours, her pain has worsened.  Patient reports that she was walking and felt a "pop" in the back of her right knee.  She states that she had difficulty standing and walking while at work last evening.  Patient reports that she did see orthopedics last week, and she was given a steroid injection in the fourth "front of the knee".  Patient reports that she has started using a cane to help with ambulation, feels that the right knee is unsteady and weak..  She states that she has not had any swelling or redness.  She denies any new trauma or injury, radiation of pain, or inability to bear weight.  Patient states that she has been taking Tylenol for her symptoms with minimal relief.  She states that she has not noticed a great deal of relief after the steroid injection.  Past Medical History:  Diagnosis Date   Fibromyalgia    GERD (gastroesophageal reflux disease)    High cholesterol    Hypertension    IBS (irritable bowel syndrome)    UTI (lower urinary tract infection)     Patient Active Problem List   Diagnosis Date Noted   Trichomonal vaginitis 02/02/2013    Past Surgical History:  Procedure Laterality Date   colonscopy      OB History     Gravida  3   Para  3   Term  3   Preterm      AB      Living  3      SAB      IAB      Ectopic      Multiple      Live Births  3            Home Medications    Prior to Admission medications   Medication Sig Start Date End Date Taking? Authorizing Provider  diclofenac Sodium (PENNSAID) 2 % SOLN Apply 2 Pump (40 mg  total) topically 2 (two) times daily as needed. 11/05/22  Yes Rosalie Gelpi-Warren, Sadie Haber, NP  amLODipine (NORVASC) 10 MG tablet Take 10 mg by mouth daily. 06/03/17   [provider]  atorvastatin (LIPITOR) 20 MG tablet Take 20 mg by mouth at bedtime. 06/07/21   [provider]  benzonatate (TESSALON) 100 MG capsule Take 1 capsule (100 mg total) by mouth 3 (three) times daily as needed for cough. Do not take with alcohol or while driving or operating heavy machinery 11/06/21   Valentino Nose, NP  cholecalciferol (VITAMIN D3) 25 MCG (1000 UNIT) tablet Take by mouth daily. Unsure of mg    [provider]  clidinium-chlordiazePOXIDE (LIBRAX) 2.5-5 MG per capsule Take 1 capsule by mouth 3 (three) times daily before meals.     [provider]  DULoxetine (CYMBALTA) 60 MG capsule Take 60 mg by mouth daily. 06/07/21   [provider]  Esomeprazole Magnesium 20 MG TBEC Take 20 mg by mouth daily. 05/25/15   [provider]  famotidine (PEPCID) 20  MG tablet Take by mouth. 12/07/20   [provider]  ferrous sulfate 325 (65 FE) MG EC tablet Take 325 mg by mouth at bedtime.    [provider]  fluticasone (FLONASE) 50 MCG/ACT nasal spray Place 2 sprays into both nostrils daily. 10/31/20   Domenick Gong, MD  glucosamine-chondroitin 500-400 MG tablet Take 1 tablet by mouth 2 (two) times daily.    [provider]  magnesium 30 MG tablet Take by mouth daily. Unsure of mg    [provider]  meloxicam (MOBIC) 7.5 MG tablet Take 7.5 mg by mouth 2 (two) times daily. 07/18/17   [provider]  metFORMIN (GLUCOPHAGE-XR) 500 MG 24 hr tablet Take 500 mg by mouth every morning. 05/24/21   [provider]  pantoprazole (PROTONIX) 40 MG tablet Take 40 mg by mouth daily. 04/01/21   [provider]  pravastatin (PRAVACHOL) 40 MG tablet Take 40 mg by mouth every evening. 07/08/17   [provider]  pregabalin  (LYRICA) 100 MG capsule Take 100 mg by mouth 3 (three) times daily. 05/06/17   [provider]  promethazine-dextromethorphan (PROMETHAZINE-DM) 6.25-15 MG/5ML syrup Take 5 mLs by mouth at bedtime as needed for cough. Do not take with alcohol or while driving or operating heavy machinery 11/06/21   Valentino Nose, NP  propranolol (INDERAL) 10 MG tablet Take 10 mg by mouth daily. 06/07/21   [provider]  sulfaSALAzine (AZULFIDINE) 500 MG tablet Take 500-1,000 mg by mouth 2 (two) times daily.  05/22/16   [provider]    Family History Family History  Problem Relation Age of Onset   Heart failure Paternal Grandmother    Breast cancer Maternal Grandmother    Prostate cancer Maternal Grandfather    Bone cancer Father    Heart disease Mother     Social History Social History   Tobacco Use   Smoking status: Never   Smokeless tobacco: Never  Vaping Use   Vaping status: Never Used  Substance Use Topics   Alcohol use: No   Drug use: No     Allergies   Patient has no known allergies.   Review of Systems Review of Systems Per HPI  Physical Exam Triage Vital Signs ED Triage Vitals  Encounter Vitals Group     BP 11/05/22 1148 131/85     Systolic BP Percentile --      Diastolic BP Percentile --      Pulse Rate 11/05/22 1148 82     Resp 11/05/22 1148 16     Temp 11/05/22 1148 98 F (36.7 C)     Temp Source 11/05/22 1148 Oral     SpO2 11/05/22 1148 95 %     Weight --      Height --      Head Circumference --      Peak Flow --      Pain Score 11/05/22 1149 10     Pain Loc --      Pain Education --      Exclude from Growth Chart --    No data found.  Updated Vital Signs BP 131/85 (BP Location: Right Arm)   Pulse 82   Temp 98 F (36.7 C) (Oral)   Resp 16   LMP 12/18/2012   SpO2 95%   Visual Acuity Right Eye Distance:   Left Eye Distance:   Bilateral Distance:    Right Eye Near:   Left Eye Near:    Bilateral  Near:      Physical Exam Vitals and nursing note reviewed.  Constitutional:      General: She is not in acute distress.    Appearance: Normal appearance.  HENT:     Head: Normocephalic.  Eyes:     Extraocular Movements: Extraocular movements intact.     Pupils: Pupils are equal, round, and reactive to light.  Pulmonary:     Effort: Pulmonary effort is normal.  Musculoskeletal:     Cervical back: Normal range of motion.     Right knee: No swelling, deformity or erythema. Decreased range of motion. Tenderness (Posterior aspect of right knee) present over the LCL. Normal pulse.  Skin:    General: Skin is warm and dry.  Neurological:     General: No focal deficit present.     Mental Status: She is alert and oriented to person, place, and time.  Psychiatric:        Mood and Affect: Mood normal.        Behavior: Behavior normal.      UC Treatments / Results  Labs (all labs ordered are listed, but only abnormal results are displayed) Labs Reviewed - No data to display  EKG   Radiology No results found.  Procedures Procedures (including critical care time)  Medications Ordered in UC Medications - No data to display  Initial Impression / Assessment and Plan / UC Course  I have reviewed the triage vital signs and the nursing notes.  Pertinent labs & imaging results that were available during my care of the patient were reviewed by me and considered in my medical decision making (see chart for details).  The patient is well-appearing, she is in no acute distress, vital signs are stable.  Imaging was deferred at this time as imaging was completed last week, patient has not experienced any new trauma since that imaging.  She does have tenderness in the posterior aspect of the right knee, she also states that the knee feels weak or unsteady.  Patient was provided a hinged knee brace to provide compression and support.  Pennsaid 2% solution was also prescribed to help with knee pain.   Supportive care recommendations were provided and discussed with the patient to include over-the-counter analgesics, RICE therapy, and gentle range of motion exercises.  Patient was advised to keep scheduled appointment with orthopedics on 9/5.  Advised that she can follow-up with [facility] if symptoms worsen before that time.  Patient is in agreement with this plan of care and verbalizes understanding.  All questions were answered.  Patient stable for discharge.  Work note was provided.   Final Clinical Impressions(s) / UC Diagnoses   Final diagnoses:  Right knee pain, unspecified chronicity     Discharge Instructions      Apply medication as prescribed. May continue over-the-counter Tylenol.  Recommend Tylenol arthritis strength as needed for pain or discomfort. A knee brace has been provided.  Wear the knee brace when you are engaged in prolonged or strenuous activity. RICE therapy, rest, ice, compression and elevation.  Apply ice for 20 minutes, remove for 1 hour, repeat as needed. Gentle range of motion exercises as tolerated for knee pain or discomfort. As discussed, I would like for you to follow-up with orthopedics as scheduled.  If symptoms worsen before that time, you can follow-up with [facility]. Follow-up as needed.     ED Prescriptions     Medication Sig Dispense Auth. Provider   diclofenac Sodium (PENNSAID) 2 % SOLN Apply  2 Pump (40 mg total) topically 2 (two) times daily as needed. 112 g Romeo Zielinski-Warren, Sadie Haber, NP      PDMP not reviewed this encounter.   Abran Cantor, NP 11/05/22 1259

## 2022-11-05 NOTE — ED Triage Notes (Signed)
Pt states right knee pain for the past 2 weeks. States she saw ortho last Thursday and was given a cortisone shot but she is still having pain. Pt ambulates with a limp and is using a cane.

## 2022-11-05 NOTE — Discharge Instructions (Signed)
Apply medication as prescribed. May continue over-the-counter Tylenol.  Recommend Tylenol arthritis strength as needed for pain or discomfort. A knee brace has been provided.  Wear the knee brace when you are engaged in prolonged or strenuous activity. RICE therapy, rest, ice, compression and elevation.  Apply ice for 20 minutes, remove for 1 hour, repeat as needed. Gentle range of motion exercises as tolerated for knee pain or discomfort. As discussed, I would like for you to follow-up with orthopedics as scheduled.  If symptoms worsen before that time, you can follow-up with [facility]. Follow-up as needed.
